# Patient Record
Sex: Male | Born: 1990 | Race: Black or African American | Hispanic: No | Marital: Single | Smoking: Never smoker
Health system: Southern US, Community
[De-identification: ages and names within clinical notes are randomized; demographics above are authoritative.]

## PROBLEM LIST (undated history)

## (undated) ENCOUNTER — Emergency Department (HOSPITAL_COMMUNITY): Admission: EM | Payer: BLUE CROSS/BLUE SHIELD | Source: Home / Self Care

## (undated) ENCOUNTER — Emergency Department (HOSPITAL_COMMUNITY): Admission: EM | Payer: Self-pay | Source: Home / Self Care

## (undated) HISTORY — PX: WRIST SURGERY: SHX841

---

## 2011-01-12 ENCOUNTER — Emergency Department (HOSPITAL_COMMUNITY): Payer: BC Managed Care – PPO

## 2011-01-12 ENCOUNTER — Emergency Department (HOSPITAL_COMMUNITY)
Admission: EM | Admit: 2011-01-12 | Discharge: 2011-01-12 | Disposition: A | Discharge status: Home / Self Care | Payer: BC Managed Care – PPO | Attending: Emergency Medicine | Admitting: Emergency Medicine

## 2011-01-12 DIAGNOSIS — Y9367 Activity, basketball: Secondary | ICD-10-CM | POA: Insufficient documentation

## 2011-01-12 DIAGNOSIS — X500XXA Overexertion from strenuous movement or load, initial encounter: Secondary | ICD-10-CM | POA: Insufficient documentation

## 2011-01-12 DIAGNOSIS — S93409A Sprain of unspecified ligament of unspecified ankle, initial encounter: Secondary | ICD-10-CM | POA: Insufficient documentation

## 2011-01-12 DIAGNOSIS — M25579 Pain in unspecified ankle and joints of unspecified foot: Secondary | ICD-10-CM | POA: Insufficient documentation

## 2011-01-17 ENCOUNTER — Inpatient Hospital Stay (INDEPENDENT_AMBULATORY_CARE_PROVIDER_SITE_OTHER)
Admission: RE | Admit: 2011-01-17 | Discharge: 2011-01-17 | Disposition: A | Discharge status: Home / Self Care | Payer: Self-pay | Source: Ambulatory Visit | Attending: Emergency Medicine | Admitting: Emergency Medicine

## 2011-01-17 DIAGNOSIS — N342 Other urethritis: Secondary | ICD-10-CM

## 2011-01-18 LAB — GC/CHLAMYDIA PROBE AMP, GENITAL
Chlamydia, DNA Probe: POSITIVE — AB
GC Probe Amp, Genital: NEGATIVE

## 2011-04-28 ENCOUNTER — Emergency Department (HOSPITAL_COMMUNITY)
Admission: EM | Admit: 2011-04-28 | Discharge: 2011-04-28 | Disposition: A | Discharge status: Home / Self Care | Payer: No Typology Code available for payment source | Attending: Emergency Medicine | Admitting: Emergency Medicine

## 2011-04-28 ENCOUNTER — Encounter (HOSPITAL_COMMUNITY): Payer: Self-pay | Admitting: *Deleted

## 2011-04-28 DIAGNOSIS — S139XXA Sprain of joints and ligaments of unspecified parts of neck, initial encounter: Secondary | ICD-10-CM | POA: Insufficient documentation

## 2011-04-28 DIAGNOSIS — M546 Pain in thoracic spine: Secondary | ICD-10-CM | POA: Insufficient documentation

## 2011-04-28 DIAGNOSIS — M542 Cervicalgia: Secondary | ICD-10-CM | POA: Insufficient documentation

## 2011-04-28 DIAGNOSIS — M25519 Pain in unspecified shoulder: Secondary | ICD-10-CM | POA: Insufficient documentation

## 2011-04-28 DIAGNOSIS — S161XXA Strain of muscle, fascia and tendon at neck level, initial encounter: Secondary | ICD-10-CM

## 2011-04-28 MED ORDER — OXYCODONE-ACETAMINOPHEN 5-325 MG PO TABS
1.0000 | ORAL_TABLET | Freq: Once | ORAL | Status: AC
  Administered 2011-04-28: 1 via ORAL
  Filled 2011-04-28: qty 1

## 2011-04-28 MED ORDER — IBUPROFEN 200 MG PO TABS
400.0000 mg | ORAL_TABLET | Freq: Once | ORAL | Status: AC
  Administered 2011-04-28: 400 mg via ORAL
  Filled 2011-04-28: qty 2

## 2011-04-28 MED ORDER — OXYCODONE-ACETAMINOPHEN 5-325 MG PO TABS: 1.0000 | ORAL_TABLET | ORAL | Status: DC | PRN

## 2011-04-28 MED ORDER — IBUPROFEN 400 MG PO TABS: 400.0000 mg | ORAL_TABLET | Freq: Four times a day (QID) | ORAL | Status: DC | PRN
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
## 2011-05-08 ENCOUNTER — Emergency Department (INDEPENDENT_AMBULATORY_CARE_PROVIDER_SITE_OTHER)
Admission: EM | Admit: 2011-05-08 | Discharge: 2011-05-08 | Disposition: A | Discharge status: Home / Self Care | Payer: Self-pay | Source: Home / Self Care | Attending: Emergency Medicine | Admitting: Emergency Medicine

## 2011-05-08 ENCOUNTER — Encounter (HOSPITAL_COMMUNITY): Payer: Self-pay | Admitting: *Deleted

## 2011-05-08 DIAGNOSIS — N342 Other urethritis: Secondary | ICD-10-CM

## 2011-05-08 MED ORDER — CEFTRIAXONE SODIUM 250 MG IJ SOLR
250.0000 mg | Freq: Once | INTRAMUSCULAR | Status: AC
  Administered 2011-05-08: 250 mg via INTRAMUSCULAR

## 2011-05-08 MED ORDER — LIDOCAINE HCL (PF) 1 % IJ SOLN
INTRAMUSCULAR | Status: AC
  Filled 2011-05-08: qty 5

## 2011-05-08 MED ORDER — CEFTRIAXONE SODIUM 250 MG IJ SOLR
INTRAMUSCULAR | Status: AC
  Filled 2011-05-08: qty 250

## 2011-05-08 MED ORDER — AZITHROMYCIN 250 MG PO TABS
1000.0000 mg | ORAL_TABLET | Freq: Once | ORAL | Status: AC
  Administered 2011-05-08: 1000 mg via ORAL

## 2011-05-08 MED ORDER — AZITHROMYCIN 250 MG PO TABS
ORAL_TABLET | ORAL | Status: AC
  Filled 2011-05-08: qty 4

## 2011-05-08 NOTE — ED Notes (Signed)
pT  WANTS  TO  BE  CHECKED  FOR  AN STD   HE  DENYS  ANY  PENILE  DISCHARGE  HE  DENYS  ANY  BURNING  ON  URINATION  HE  DENYS  ANY  SORES  OR  LESIONS  -  HE  SAYS  IT  FEELS  DIFFERENT  WHEN HE  HAS  SEX  AND  HAS  A  PROLONGED PERIOD  BEFORE  EJACULATION    HE  STATES  HE  WAS  NON  COMPLIANT  ON PREVIOUS  STD  [place]  IN PAST

## 2011-05-08 NOTE — ED Provider Notes (Signed)
Chief Complaint  Patient presents with  . Exposure to STD    History of Present Illness:  The patient is a 21 year old male who presents tonight for evaluation for STDs. Both he and his girlfriend have had chlamydia in the past. He's not sure whether she has had treatment for it. They had unprotected intercourse 2 weeks ago and ever since then he's noted urethral irritation, some dribbling after voiding, some hesitancy, but no discharge, dysuria, or penile pain. He's had no fever, chills, adenopathy, skin rash, testicular pain, or swelling. The patient states he has just one sexual partner. He does not use condoms.  Review of Systems:  Other than noted above, the patient denies any of the following symptoms: Systemic:  No fevers chills, aches, weight loss, arthralgias, myalgias, or adenopathy. GI:  No abdominal pain, nausea or vomiting. GU:  No dysuria, penile pain, discharge, itching, dysuria, genital lesions, testicular pain or swelling. Skin:  No rash or itching.  PMFSH:  Past medical history, family history, social history, meds, and allergies were reviewed.  Physical Exam:   Vital signs:  BP 115/70  Pulse 88  Temp(Src) 98.4 F (36.9 C) (Oral)  Resp 14  SpO2 99% Gen:  Alert, oriented, in no distress. Abdomen:  Soft and flat, non-distended, and non-tender.  No organomegaly or mass. Genital:  There was no urethral discharge, no lesions on the penis of the genital area testes were normal, nontender, without any masses. There was no inguinal lymphadenopathy. Skin:  Warm and dry.  No rash.   Labs:   Results for orders placed during the hospital encounter of 01/17/11  GC/CHLAMYDIA PROBE AMP, GENITAL      Component Value Range   GC Probe Amp, Genital NEGATIVE  NEGATIVE    Chlamydia, DNA Probe POSITIVE (*) NEGATIVE      Medications given in [facility]:  He was given ceftriaxone 250 mg IM and azithromycin 1 g by mouth.  Assessment:   Diagnoses that have been ruled out:  None  Diagnoses  that are still under consideration:  None  Final diagnoses:  Urethritis    Plan:   1.  The following meds were prescribed:   New Prescriptions   No medications on file   2.  The patient was instructed in symptomatic care and handouts were given. 3.  The patient was told to return if becoming worse in any way, if no better in 3 or 4 days, and given some red flag symptoms that would indicate earlier return. 4.  The patient was instructed to inform all sexual contacts, avoid intercourse completely for 2 weeks and then only with a condom.  The patient was told that we would call about all abnormal lab results, and that we would need to report certain kinds of infection to the health department.    Roque Lias, MD 05/08/11 1900

## 2011-05-09 LAB — HIV ANTIBODY (ROUTINE TESTING W REFLEX): HIV: NONREACTIVE

## 2011-05-09 LAB — GC/CHLAMYDIA PROBE AMP, GENITAL
Chlamydia, DNA Probe: NEGATIVE
GC Probe Amp, Genital: NEGATIVE

## 2011-05-11 ENCOUNTER — Telehealth (HOSPITAL_COMMUNITY): Payer: Self-pay | Admitting: *Deleted

## 2011-08-17 ENCOUNTER — Encounter (HOSPITAL_COMMUNITY): Payer: Self-pay

## 2011-08-17 ENCOUNTER — Emergency Department (HOSPITAL_COMMUNITY)
Admission: EM | Admit: 2011-08-17 | Discharge: 2011-08-17 | Disposition: A | Discharge status: Home / Self Care | Payer: BC Managed Care – PPO | Attending: Emergency Medicine | Admitting: Emergency Medicine

## 2011-08-17 DIAGNOSIS — Z202 Contact with and (suspected) exposure to infections with a predominantly sexual mode of transmission: Secondary | ICD-10-CM

## 2011-08-17 LAB — URINALYSIS, ROUTINE W REFLEX MICROSCOPIC
Bilirubin Urine: NEGATIVE
Hgb urine dipstick: NEGATIVE
Ketones, ur: NEGATIVE mg/dL
Specific Gravity, Urine: 1.023 (ref 1.005–1.030)
Urobilinogen, UA: 1 mg/dL (ref 0.0–1.0)

## 2011-08-17 MED ORDER — CEFTRIAXONE SODIUM 250 MG IJ SOLR
250.0000 mg | Freq: Once | INTRAMUSCULAR | Status: AC
  Administered 2011-08-17: 250 mg via INTRAMUSCULAR
  Filled 2011-08-17: qty 250

## 2011-08-17 MED ORDER — AZITHROMYCIN 250 MG PO TABS
1000.0000 mg | ORAL_TABLET | Freq: Once | ORAL | Status: AC
  Administered 2011-08-17: 1000 mg via ORAL
  Filled 2011-08-17: qty 4

## 2011-08-17 MED ORDER — LIDOCAINE HCL (PF) 1 % IJ SOLN
INTRAMUSCULAR | Status: AC
  Administered 2011-08-17: 5 mL
  Filled 2011-08-17: qty 5

## 2011-08-17 NOTE — ED Notes (Signed)
Pt reporting STD exposure, reporting symptomatic today; c/o white discharge, denying any burning or pain with urination. Pt ambulated to room; denying any pain at this time. NAD

## 2011-08-17 NOTE — Discharge Instructions (Signed)
Sexually Transmitted Disease Sexually transmitted disease (STD) refers to any infection that is passed from person to person during sexual activity. This may happen by way of saliva, semen, blood, vaginal mucus, or urine. Common STDs include:  Gonorrhea.   Chlamydia.   Syphilis.   HIV/AIDS.   Genital herpes.   Hepatitis B and C.   Trichomonas.   Human papillomavirus (HPV).   Pubic lice.  CAUSES  An STD may be spread by bacteria, virus, or parasite. A person can get an STD by:  Sexual intercourse with an infected person.   Sharing sex toys with an infected person.   Sharing needles with an infected person.   Having intimate contact with the genitals, mouth, or rectal areas of an infected person.  SYMPTOMS  Some people may not have any symptoms, but they can still pass the infection to others. Different STDs have different symptoms. Symptoms include:  Painful or bloody urination.   Pain in the pelvis, abdomen, vagina, anus, throat, or eyes.   Skin rash, itching, irritation, growths, or sores (lesions). These usually occur in the genital or anal area.   Abnormal vaginal discharge.   Penile discharge in men.   Soft, flesh-colored skin growths in the genital or anal area.   Fever.   Pain or bleeding during sexual intercourse.   Swollen glands in the groin area.   Yellow skin and eyes (jaundice). This is seen with hepatitis.  DIAGNOSIS  To make a diagnosis, your caregiver may:  Take a medical history.   Perform a physical exam.   Take a specimen (culture) to be examined.   Examine a sample of discharge under a microscope.   Perform blood tests.   Perform a Pap test, if this applies.   Perform a colposcopy.   Perform a laparoscopy.  TREATMENT   Chlamydia, gonorrhea, trichomonas, and syphilis can be cured with antibiotic medicine.   Genital herpes, hepatitis, and HIV can be treated, but not cured, with prescribed medicines. The medicines will lessen  the symptoms.   Genital warts from HPV can be treated with medicine or by freezing, burning (electrocautery), or surgery. Warts may come back.   HPV is a virus and cannot be cured with medicine or surgery.However, abnormal areas may be followed very closely by your caregiver and may be removed from the cervix, vagina, or vulva through office procedures or surgery.  If your diagnosis is confirmed, your recent sexual partners need treatment. This is true even if they are symptom-free or have a negative culture or evaluation. They should not have sex until their caregiver says it is okay. HOME CARE INSTRUCTIONS  All sexual partners should be informed, tested, and treated for all STDs.   Take your antibiotics as directed. Finish them even if you start to feel better.   Only take over-the-counter or prescription medicines for pain, discomfort, or fever as directed by your caregiver.   Rest.   Eat a balanced diet and drink enough fluids to keep your urine clear or pale yellow.   Do not have sex until treatment is completed and you have followed up with your caregiver. STDs should be checked after treatment.   Keep all follow-up appointments, Pap tests, and blood tests as directed by your caregiver.   Only use latex condoms and water-soluble lubricants during sexual activity. Do not use petroleum jelly or oils.   Avoid alcohol and illegal drugs.   Get vaccinated for HPV and hepatitis. If you have not received these vaccines   in the past, talk to your caregiver about whether one or both might be right for you.   Avoid risky sex practices that can break the skin.  The only way to avoid getting an STD is to avoid all sexual activity.Latex condoms and dental dams (for oral sex) will help lessen the risk of getting an STD, but will not completely eliminate the risk. SEEK MEDICAL CARE IF:   You have a fever.   You have any new or worsening symptoms.  Document Released: 06/17/2002 Document  Revised: 03/16/2011 Document Reviewed: 06/24/2010 [employer] Patient Information 2012 [employer]. 

## 2011-08-17 NOTE — ED Notes (Signed)
Pt sts exposed to std, sts unknown which one it is.

## 2011-08-17 NOTE — ED Provider Notes (Signed)
Medical screening examination/treatment/procedure(s) were performed by non-physician practitioner and as supervising physician I was immediately available for consultation/collaboration.   Forbes Cellar, MD 08/17/11 1651

## 2011-08-17 NOTE — ED Provider Notes (Signed)
History     CSN: [id number]  Arrival date & time 08/17/11  1450   First MD Initiated Contact with Patient 08/17/11 1537      Chief Complaint  Patient presents with  . Exposure to STD    (Consider location/radiation/quality/duration/timing/severity/associated sxs/prior treatment) Patient is a 21 y.o. male presenting with STD exposure. The history is provided by the patient. No language interpreter was used.  Exposure to STD This is a new problem. The current episode started in the past 7 days. The problem occurs 2 to 4 times per day. The problem has been gradually worsening. Associated symptoms include urinary symptoms. Pertinent negatives include no abdominal pain, fever, nausea, sore throat, swollen glands or vomiting.    History reviewed. No pertinent past medical history.  Past Surgical History  Procedure Date  . Wrist surgery     History reviewed. No pertinent family history.  History  Substance Use Topics  . Smoking status: Never Smoker   . Smokeless tobacco: Not on file  . Alcohol Use: No      Review of Systems  Constitutional: Negative for fever.  HENT: Negative for sore throat.   Gastrointestinal: Negative for nausea, vomiting and abdominal pain.  Genitourinary: Positive for discharge. Negative for frequency, difficulty urinating, genital sores and testicular pain.  All other systems reviewed and are negative.    Allergies  Review of patient's allergies indicates no known allergies.  Home Medications  No current outpatient prescriptions on file.  BP 125/68  Pulse 75  Temp(Src) 98.3 F (36.8 C) (Oral)  Resp 18  SpO2 98%  Physical Exam  Nursing note and vitals reviewed. Constitutional: He is oriented to person, place, and time. He appears well-developed and well-nourished. No distress.  HENT:  Head: Normocephalic.  Eyes: Pupils are equal, round, and reactive to light.  Neck: Normal range of motion. Neck supple.  Cardiovascular: Normal rate,  regular rhythm, normal heart sounds and intact distal pulses.   Pulmonary/Chest: Effort normal and breath sounds normal.  Abdominal: Soft. Bowel sounds are normal. There is no tenderness.  Genitourinary: Testes normal. Cremasteric reflex is present. Discharge found.  Musculoskeletal: Normal range of motion.  Lymphadenopathy:    He has no cervical adenopathy.  Neurological: He is alert and oriented to person, place, and time.  Skin: Skin is warm and dry.  Psychiatric: He has a normal mood and affect. His behavior is normal. Judgment and thought content normal.    ED Course  Procedures (including critical care time)   Labs Reviewed  URINALYSIS, ROUTINE W REFLEX MICROSCOPIC  GC/CHLAMYDIA PROBE AMP, GENITAL   No results found.   No diagnosis found.  STD exposure.  MDM          Jimmye Norman, NP 08/17/11 1649

## 2011-08-18 LAB — GC/CHLAMYDIA PROBE AMP, GENITAL: Chlamydia, DNA Probe: NEGATIVE

## 2012-12-03 IMAGING — CR DG ANKLE COMPLETE 3+V*R*
3 series · 3 of 3 positions shown · non-contrast
Comparison: None.

CLINICAL DATA: Twisting basketball injury, swelling

RIGHT ANKLE - COMPLETE 3+ VIEW

[t ankle joint ap right]
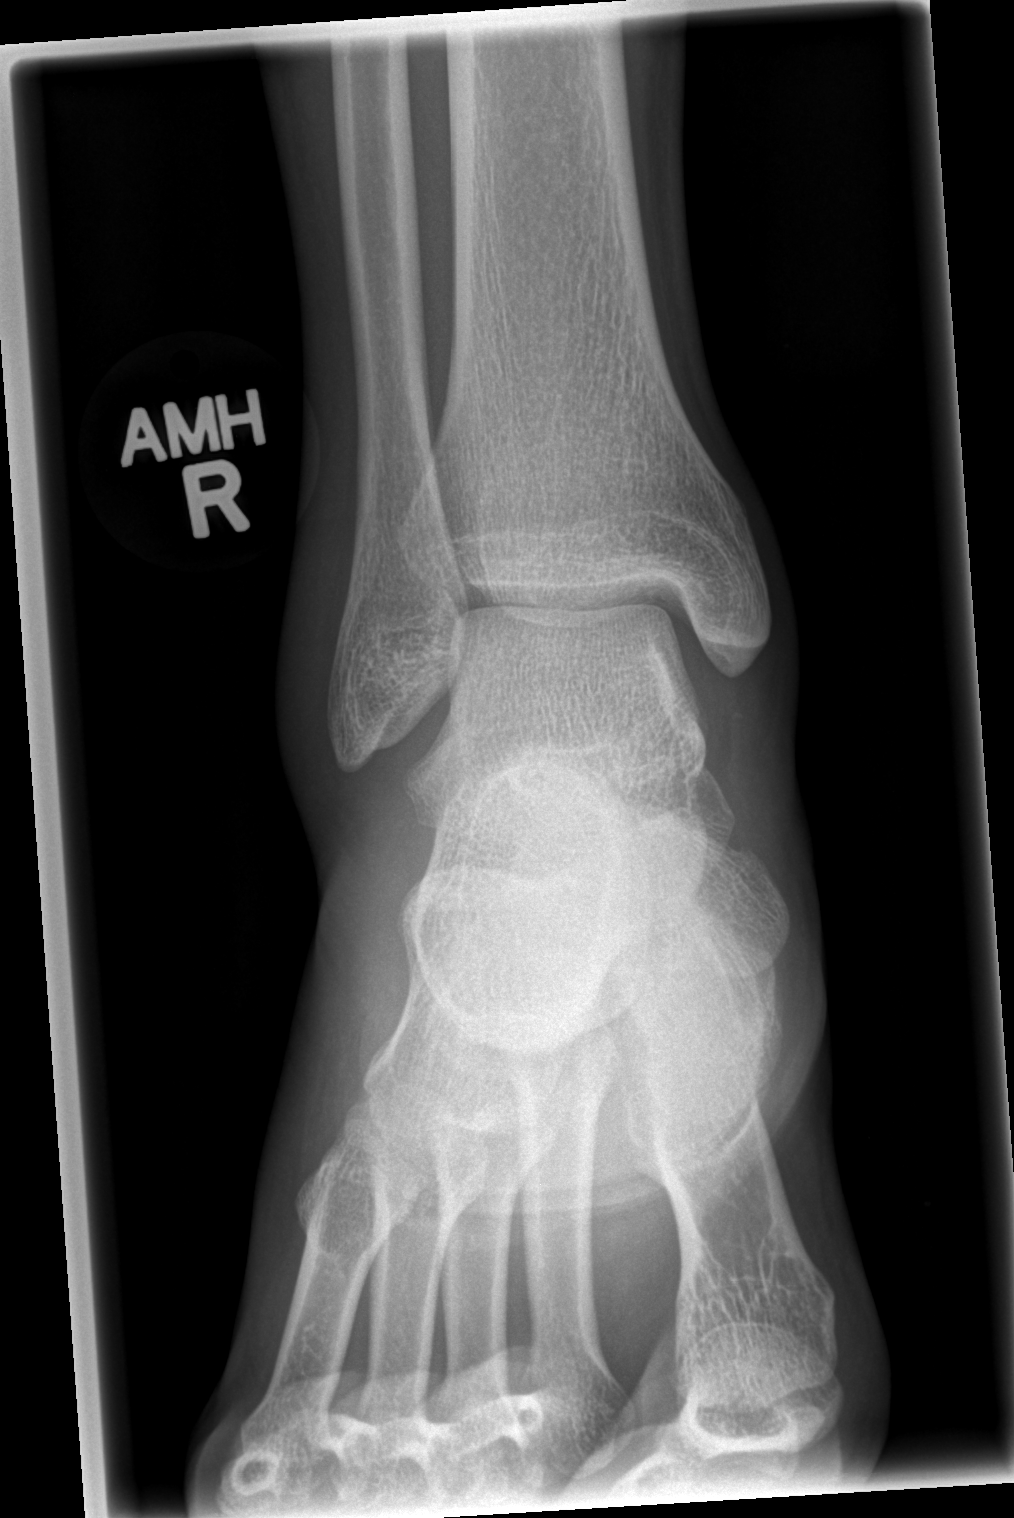

[t ankle joint oblique right]
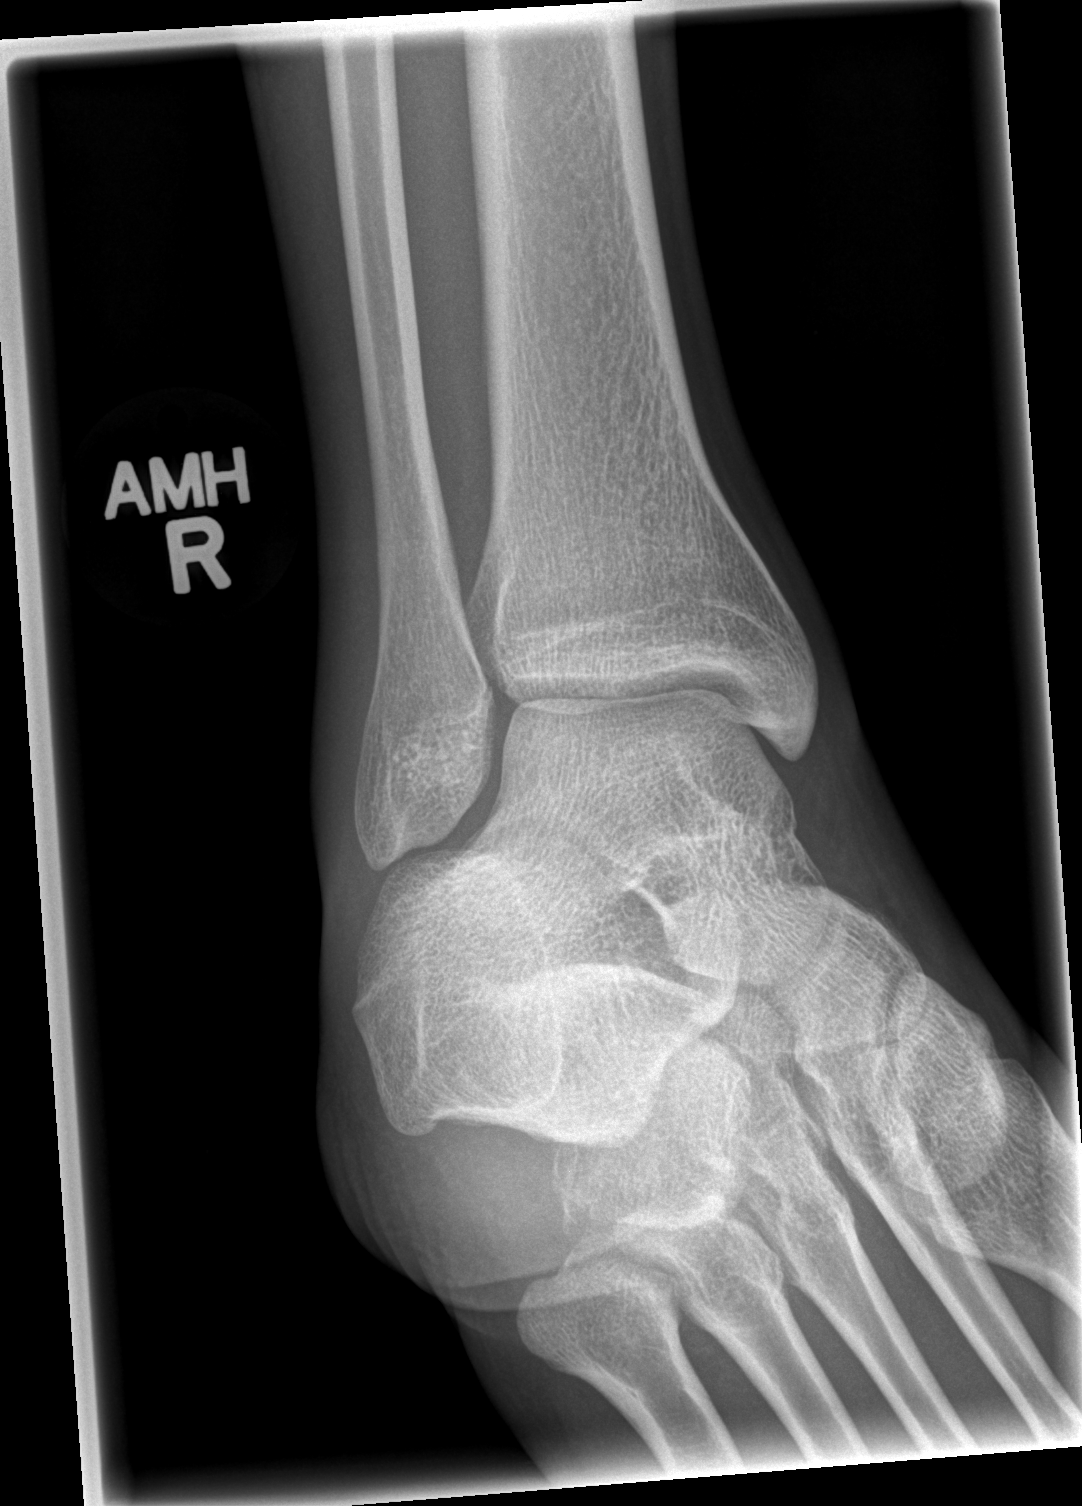

[t ankle joint lat right]
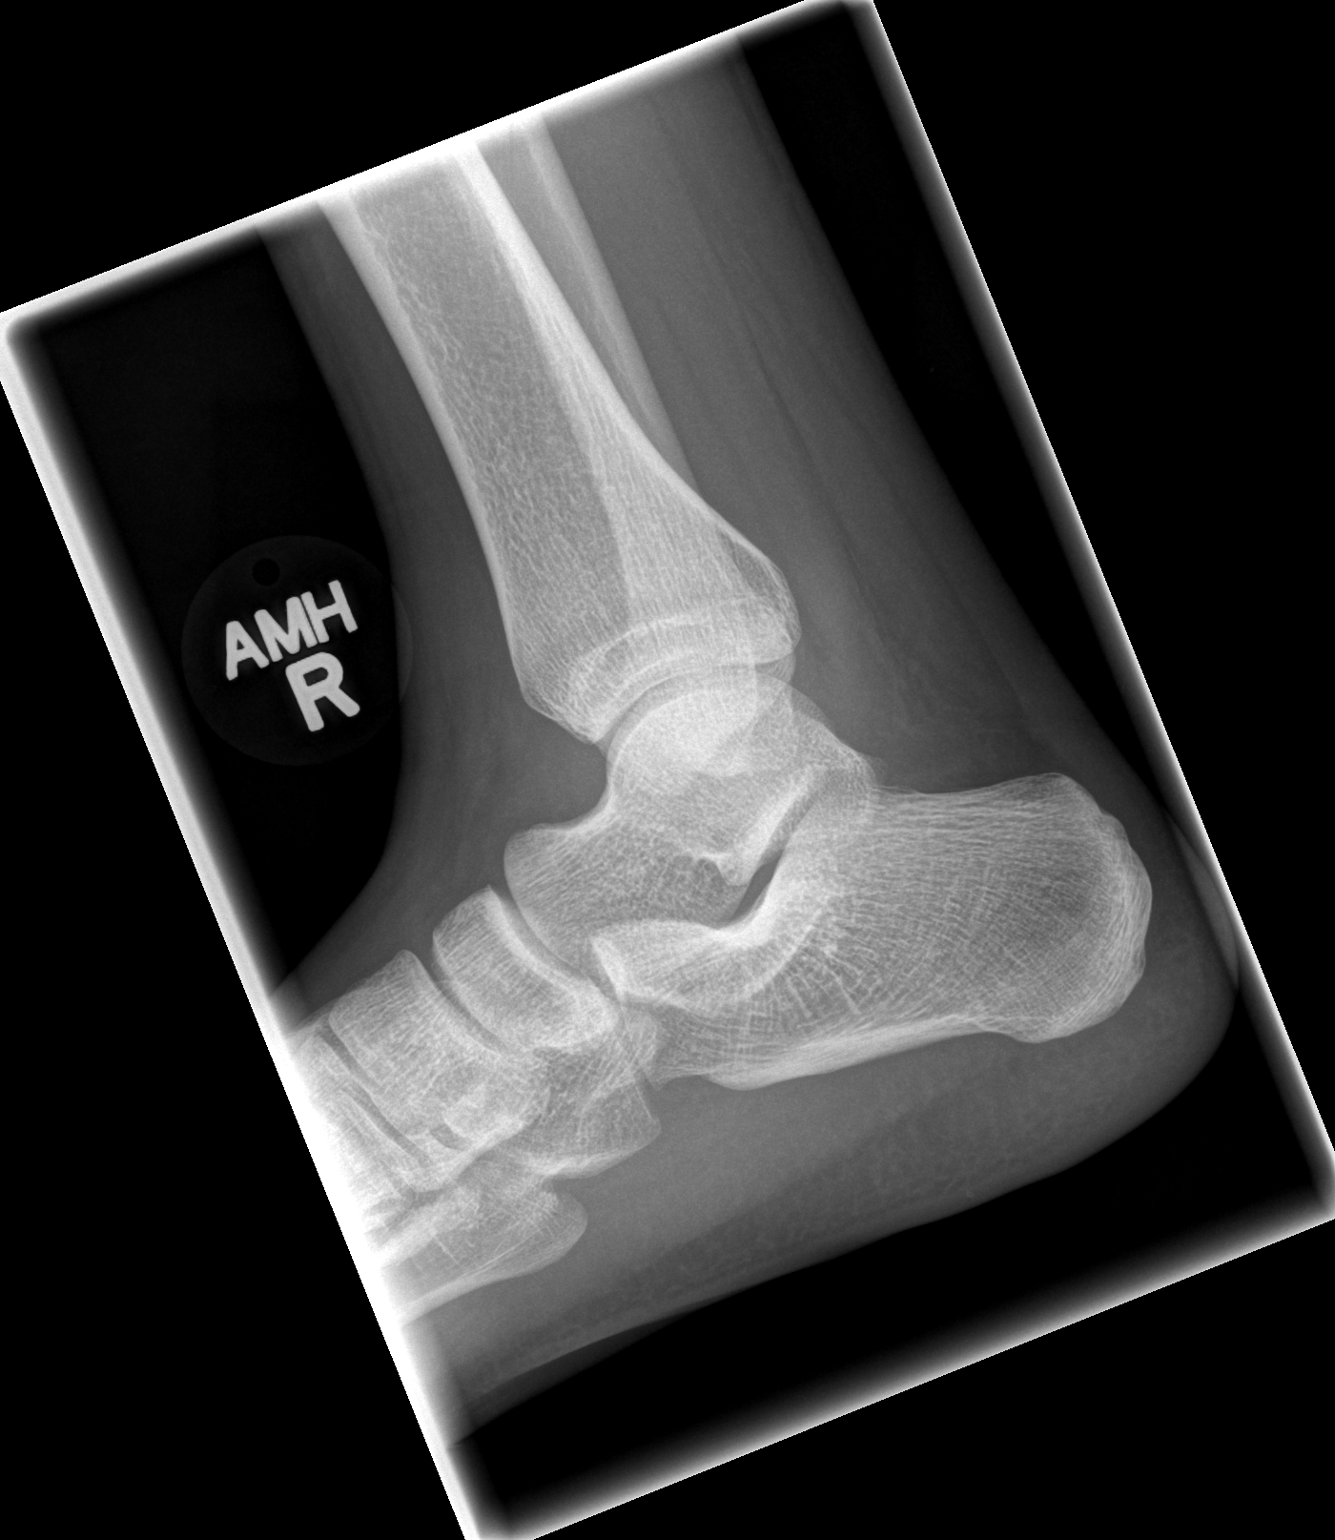

[3 of 3 positions shown; findings below may reference images not displayed]

FINDINGS: Mild swelling laterally of the soft tissues.  No
fracture.  Normal alignment.  Intact malleoli, talus and calcaneus.
IMPRESSION: Mild soft tissue swelling.  No acute osseous finding

## 2014-05-14 ENCOUNTER — Emergency Department (HOSPITAL_COMMUNITY)
Admission: EM | Admit: 2014-05-14 | Discharge: 2014-05-14 | Disposition: A | Discharge status: Home / Self Care | Payer: No Typology Code available for payment source | Attending: Emergency Medicine | Admitting: Emergency Medicine

## 2014-05-14 ENCOUNTER — Emergency Department (HOSPITAL_COMMUNITY): Payer: No Typology Code available for payment source

## 2014-05-14 ENCOUNTER — Encounter (HOSPITAL_COMMUNITY): Payer: Self-pay | Admitting: *Deleted

## 2014-05-14 DIAGNOSIS — Y998 Other external cause status: Secondary | ICD-10-CM | POA: Insufficient documentation

## 2014-05-14 DIAGNOSIS — S3992XA Unspecified injury of lower back, initial encounter: Secondary | ICD-10-CM | POA: Insufficient documentation

## 2014-05-14 DIAGNOSIS — S6991XA Unspecified injury of right wrist, hand and finger(s), initial encounter: Secondary | ICD-10-CM | POA: Insufficient documentation

## 2014-05-14 DIAGNOSIS — S79921A Unspecified injury of right thigh, initial encounter: Secondary | ICD-10-CM | POA: Insufficient documentation

## 2014-05-14 DIAGNOSIS — S4991XA Unspecified injury of right shoulder and upper arm, initial encounter: Secondary | ICD-10-CM | POA: Insufficient documentation

## 2014-05-14 DIAGNOSIS — Y92410 Unspecified street and highway as the place of occurrence of the external cause: Secondary | ICD-10-CM | POA: Insufficient documentation

## 2014-05-14 DIAGNOSIS — Y9389 Activity, other specified: Secondary | ICD-10-CM | POA: Insufficient documentation

## 2014-05-14 DIAGNOSIS — S29001A Unspecified injury of muscle and tendon of front wall of thorax, initial encounter: Secondary | ICD-10-CM | POA: Insufficient documentation

## 2014-05-14 DIAGNOSIS — S79922A Unspecified injury of left thigh, initial encounter: Secondary | ICD-10-CM | POA: Insufficient documentation

## 2014-05-14 MED ORDER — METHOCARBAMOL 500 MG PO TABS: 500.0000 mg | ORAL_TABLET | Freq: Two times a day (BID) | ORAL | Status: DC

## 2014-05-14 MED ORDER — IBUPROFEN 800 MG PO TABS: 800.0000 mg | ORAL_TABLET | Freq: Three times a day (TID) | ORAL | Status: AC

## 2014-05-14 MED ORDER — ACETAMINOPHEN 325 MG PO TABS
650.0000 mg | ORAL_TABLET | Freq: Once | ORAL | Status: AC
  Administered 2014-05-14: 650 mg via ORAL
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
## 2014-05-14 NOTE — Discharge Instructions (Signed)

## 2014-06-07 ENCOUNTER — Emergency Department (HOSPITAL_COMMUNITY)
Admission: EM | Admit: 2014-06-07 | Discharge: 2014-06-07 | Discharge status: Against Medical Advice | Payer: Self-pay | Attending: Emergency Medicine | Admitting: Emergency Medicine

## 2014-06-07 ENCOUNTER — Encounter (HOSPITAL_COMMUNITY): Payer: Self-pay | Admitting: Emergency Medicine

## 2014-06-07 DIAGNOSIS — Z79899 Other long term (current) drug therapy: Secondary | ICD-10-CM | POA: Insufficient documentation

## 2014-06-07 DIAGNOSIS — S61219A Laceration without foreign body of unspecified finger without damage to nail, initial encounter: Secondary | ICD-10-CM

## 2014-06-07 DIAGNOSIS — S61215A Laceration without foreign body of left ring finger without damage to nail, initial encounter: Secondary | ICD-10-CM | POA: Insufficient documentation

## 2014-06-07 DIAGNOSIS — W25XXXA Contact with sharp glass, initial encounter: Secondary | ICD-10-CM | POA: Insufficient documentation

## 2014-06-07 DIAGNOSIS — Y9289 Other specified places as the place of occurrence of the external cause: Secondary | ICD-10-CM | POA: Insufficient documentation

## 2014-06-07 DIAGNOSIS — Z791 Long term (current) use of non-steroidal anti-inflammatories (NSAID): Secondary | ICD-10-CM | POA: Insufficient documentation

## 2014-06-07 DIAGNOSIS — S61213A Laceration without foreign body of left middle finger without damage to nail, initial encounter: Secondary | ICD-10-CM | POA: Insufficient documentation

## 2014-06-07 DIAGNOSIS — Y9389 Activity, other specified: Secondary | ICD-10-CM | POA: Insufficient documentation

## 2014-06-07 DIAGNOSIS — Y998 Other external cause status: Secondary | ICD-10-CM | POA: Insufficient documentation

## 2014-06-07 NOTE — Discharge Instructions (Signed)
You're leaving AGAINST MEDICAL ADVICE. It was advised to get an x-ray to evaluate for foreign bodies of glass in your finger. Follow-up at this department or an urgent care center if you develop redness, swelling or fevers to the area.  Laceration Care, Adult A laceration is a cut or lesion that goes through all layers of the skin and into the tissue just beneath the skin. TREATMENT  Some lacerations may not require closure. Some lacerations may not be able to be closed due to an increased risk of infection. It is important to see your caregiver as soon as possible after an injury to minimize the risk of infection and maximize the opportunity for successful closure. If closure is appropriate, pain medicines may be given, if needed. The wound will be cleaned to help prevent infection. Your caregiver will use stitches (sutures), staples, wound glue (adhesive), or skin adhesive strips to repair the laceration. These tools bring the skin edges together to allow for faster healing and a better cosmetic outcome. However, all wounds will heal with a scar. Once the wound has healed, scarring can be minimized by covering the wound with sunscreen during the day for 1 full year. HOME CARE INSTRUCTIONS  For sutures or staples:  Keep the wound clean and dry.  If you were given a bandage (dressing), you should change it at least once a day. Also, change the dressing if it becomes wet or dirty, or as directed by your caregiver.  Wash the wound with soap and water 2 times a day. Rinse the wound off with water to remove all soap. Pat the wound dry with a clean towel.  After cleaning, apply a thin layer of the antibiotic ointment as recommended by your caregiver. This will help prevent infection and keep the dressing from sticking.  You may shower as usual after the first 24 hours. Do not soak the wound in water until the sutures are removed.  Only take over-the-counter or prescription medicines for pain,  discomfort, or fever as directed by your caregiver.  Get your sutures or staples removed as directed by your caregiver. For skin adhesive strips:  Keep the wound clean and dry.  Do not get the skin adhesive strips wet. You may bathe carefully, using caution to keep the wound dry.  If the wound gets wet, pat it dry with a clean towel.  Skin adhesive strips will fall off on their own. You may trim the strips as the wound heals. Do not remove skin adhesive strips that are still stuck to the wound. They will fall off in time. For wound adhesive:  You may briefly wet your wound in the shower or bath. Do not soak or scrub the wound. Do not swim. Avoid periods of heavy perspiration until the skin adhesive has fallen off on its own. After showering or bathing, gently pat the wound dry with a clean towel.  Do not apply liquid medicine, cream medicine, or ointment medicine to your wound while the skin adhesive is in place. This may loosen the film before your wound is healed.  If a dressing is placed over the wound, be careful not to apply tape directly over the skin adhesive. This may cause the adhesive to be pulled off before the wound is healed.  Avoid prolonged exposure to sunlight or tanning lamps while the skin adhesive is in place. Exposure to ultraviolet light in the first year will darken the scar.  The skin adhesive will usually remain in place for  5 to 10 days, then naturally fall off the skin. Do not pick at the adhesive film. You may need a tetanus shot if:  You cannot remember when you had your last tetanus shot.  You have never had a tetanus shot. If you get a tetanus shot, your arm may swell, get red, and feel warm to the touch. This is common and not a problem. If you need a tetanus shot and you choose not to have one, there is a rare chance of getting tetanus. Sickness from tetanus can be serious. SEEK MEDICAL CARE IF:   You have redness, swelling, or increasing pain in the  wound.  You see a red line that goes away from the wound.  You have yellowish-white fluid (pus) coming from the wound.  You have a fever.  You notice a bad smell coming from the wound or dressing.  Your wound breaks open before or after sutures have been removed.  You notice something coming out of the wound such as wood or glass.  Your wound is on your hand or foot and you cannot move a finger or toe. SEEK IMMEDIATE MEDICAL CARE IF:   Your pain is not controlled with prescribed medicine.  You have severe swelling around the wound causing pain and numbness or a change in color in your arm, hand, leg, or foot.  Your wound splits open and starts bleeding.  You have worsening numbness, weakness, or loss of function of any joint around or beyond the wound.  You develop painful lumps near the wound or on the skin anywhere on your body. MAKE SURE YOU:   Understand these instructions.  Will watch your condition.  Will get help right away if you are not doing well or get worse. Document Released: 03/27/2005 Document Revised: 06/19/2011 Document Reviewed: 09/20/2010 [facility] Patient Information 2015 [place], [place]. This information is not intended to replace advice given to you by your health care provider. Make sure you discuss any questions you have with your health care provider.

## 2014-06-07 NOTE — ED Notes (Signed)
Refused discharge vital signs.  Pt requesting bandaids to cover wounds.  Bandaids to wounds applied.

## 2014-06-07 NOTE — ED Provider Notes (Signed)
CSN: [id number]     Arrival date & time 06/07/14  1126 History  This chart was scribed for non-physician practitioner, Celene Skeen, PA-C,  working with Gwyneth Sprout, MD by Freida Busman, ED Scribe. This patient was seen in room TR06C/TR06C and the patient's care was started at 12:30 PM.    Chief Complaint  Patient presents with  . Extremity Laceration   HPI   HPI Comments:  Riley Ramos is a 24 y.o. male who presents to the Emergency Department complaining of laceration to his left ring and middle fingers following injury ~2 hours PTA. He was inserting a window into its frame when shattered lacerating his fingers. His tetanus is UTD within the last 5 years.  No alleviating factors or associated symptoms noted.   History reviewed. No pertinent past medical history. Past Surgical History  Procedure Laterality Date  . Wrist surgery     History reviewed. No pertinent family history. History  Substance Use Topics  . Smoking status: Never Smoker   . Smokeless tobacco: Not on file  . Alcohol Use: Yes     Comment: occ    Review of Systems  Constitutional: Negative for fever.  Respiratory: Negative for shortness of breath.   Cardiovascular: Negative for chest pain.  Skin: Positive for wound.      Allergies  Review of patient's allergies indicates no known allergies.  Home Medications   Prior to Admission medications   Medication Sig Start Date End Date Taking? Authorizing Provider  ibuprofen (ADVIL,MOTRIN) 800 MG tablet Take 1 tablet (800 mg total) by mouth 3 (three) times daily. 05/14/14   Fayrene Helper, PA-C  methocarbamol (ROBAXIN) 500 MG tablet Take 1 tablet (500 mg total) by mouth 2 (two) times daily. 05/14/14   Fayrene Helper, PA-C   BP 106/49 mmHg  Pulse 99  Temp(Src) 97.8 F (36.6 C)  Resp 18  Ht  (1.651 m)  Wt 136 lb (61.689 kg)  BMI 22.63 kg/m2  SpO2 98% Physical Exam  Constitutional: He is oriented to person, place, and time. He appears well-developed and  well-nourished. No distress.  HENT:  Head: Normocephalic and atraumatic.  Eyes: Conjunctivae and EOM are normal.  Neck: Normal range of motion. Neck supple.  Cardiovascular: Normal rate, regular rhythm and normal heart sounds.   Pulmonary/Chest: Effort normal and breath sounds normal.  Musculoskeletal: Normal range of motion. He exhibits no edema.  Neurological: He is alert and oriented to person, place, and time.  Skin: Skin is warm and dry.  Laceration to the distal tip of left middle and ring finger. Does not involve the nail. No active bleeding. Refill less than 3 seconds. Full range of motion of fingers, full flexion and extension at MCP, PIP and DIP.  Psychiatric: He has a normal mood and affect. His behavior is normal.  Nursing note and vitals reviewed.   ED Course  Procedures   DIAGNOSTIC STUDIES:  Oxygen Saturation is 98% on RA, normal by my interpretation.    COORDINATION OF CARE:  12:35 PM Will order XR and if negative for foreign bodies will apply dermabond to the cleaned wound. Discussed treatment plan with pt at bedside and pt agreed to plan.  Labs Review Labs Reviewed - No data to display  Imaging Review No results found.   EKG Interpretation None      MDM   Final diagnoses:  Laceration of middle finger of left hand without complication, initial encounter  Finger laceration, initial encounter   Neurovascularly intact. Contact  with shattered glass, x-ray ordered to evaluate for foreign body. Discussed after x-ray result, would Dermabond finger. Patient reports he needs to go to work and left prior to x-ray.  I personally performed the services described in this documentation, which was scribed in my presence. The recorded information has been reviewed and is accurate.   Kathrynn SpeedRobyn M Shaunika Italiano, PA-C 06/07/14 1405  Gwyneth SproutWhitney Plunkett, MD 06/07/14 1536

## 2014-06-07 NOTE — ED Notes (Signed)
Patient advised Charna Elizabethudrey RN that he is refusing xray, Magda Paganiniudrey advised PA.

## 2014-06-07 NOTE — ED Notes (Signed)
Pt with laceration to left 3rd and 4th digit from glass and some small laceration to other hand as well; bleeding controlled

## 2014-08-19 ENCOUNTER — Emergency Department (HOSPITAL_COMMUNITY)
Admission: EM | Admit: 2014-08-19 | Discharge: 2014-08-20 | Disposition: A | Discharge status: Home / Self Care | Payer: BLUE CROSS/BLUE SHIELD | Attending: Emergency Medicine | Admitting: Emergency Medicine

## 2014-08-19 ENCOUNTER — Encounter (HOSPITAL_COMMUNITY): Payer: Self-pay | Admitting: Emergency Medicine

## 2014-08-19 DIAGNOSIS — J029 Acute pharyngitis, unspecified: Secondary | ICD-10-CM | POA: Diagnosis not present

## 2014-08-19 DIAGNOSIS — Z791 Long term (current) use of non-steroidal anti-inflammatories (NSAID): Secondary | ICD-10-CM | POA: Insufficient documentation

## 2014-08-19 DIAGNOSIS — Z79899 Other long term (current) drug therapy: Secondary | ICD-10-CM | POA: Diagnosis not present

## 2014-08-19 LAB — RAPID STREP SCREEN (MED CTR MEBANE ONLY): STREPTOCOCCUS, GROUP A SCREEN (DIRECT): NEGATIVE

## 2014-08-19 MED ORDER — IBUPROFEN 600 MG PO TABS: 600.0000 mg | ORAL_TABLET | Freq: Four times a day (QID) | ORAL | Status: AC | PRN

## 2014-08-19 MED ORDER — MAGIC MOUTHWASH: 5.0000 mL | Freq: Three times a day (TID) | ORAL | Status: AC | PRN

## 2014-08-19 MED ORDER — IBUPROFEN 400 MG PO TABS
800.0000 mg | ORAL_TABLET | Freq: Once | ORAL | Status: AC
  Administered 2014-08-20: 800 mg via ORAL
  Filled 2014-08-19: qty 2

## 2014-08-19 MED ORDER — MAGIC MOUTHWASH
5.0000 mL | Freq: Once | ORAL | Status: AC
  Administered 2014-08-20: 5 mL via ORAL
  Filled 2014-08-19: qty 5

## 2014-08-19 NOTE — ED Provider Notes (Signed)
CSN: [id number]     Arrival date & time 08/19/14  2250 History   First MD Initiated Contact with Patient 08/19/14 2351     Chief Complaint  Patient presents with  . Sore Throat    The patient said he has had a sore throat for two to three days.  He denies any fever, cough or any other symptoms.      (Consider location/radiation/quality/duration/timing/severity/associated sxs/prior Treatment) Patient is a 24 y.o. male presenting with pharyngitis.  Sore Throat This is a new problem. The current episode started in the past 7 days. The problem occurs constantly. The problem has been unchanged. Associated symptoms include chills, a fever, a sore throat and swollen glands. Pertinent negatives include no abdominal pain, change in bowel habit, congestion, coughing, nausea, neck pain or vomiting. The symptoms are aggravated by eating. Treatments tried: family's left over antibiotic. The treatment provided mild relief.    History reviewed. No pertinent past medical history. Past Surgical History  Procedure Laterality Date  . Wrist surgery     History reviewed. No pertinent family history. History  Substance Use Topics  . Smoking status: Never Smoker   . Smokeless tobacco: Not on file  . Alcohol Use: Yes     Comment: occ    Review of Systems  Constitutional: Positive for fever and chills.  HENT: Positive for sore throat. Negative for congestion.   Respiratory: Negative for cough.   Gastrointestinal: Negative for nausea, vomiting, abdominal pain and change in bowel habit.  Musculoskeletal: Negative for neck pain.  All other systems reviewed and are negative.     Allergies  Review of patient's allergies indicates no known allergies.  Home Medications   Prior to Admission medications   Medication Sig Start Date End Date Taking? Authorizing Provider  Alum & Mag Hydroxide-Simeth (MAGIC MOUTHWASH) SOLN Take 5 mLs by mouth 3 (three) times daily as needed for mouth pain. 08/19/14    Bobette Leyh, PA-C  ibuprofen (ADVIL,MOTRIN) 600 MG tablet Take 1 tablet (600 mg total) by mouth every 6 (six) hours as needed. 08/19/14   Alzena Gerber, PA-C  ibuprofen (ADVIL,MOTRIN) 800 MG tablet Take 1 tablet (800 mg total) by mouth 3 (three) times daily. 05/14/14   Fayrene HelperBowie Tran, PA-C  methocarbamol (ROBAXIN) 500 MG tablet Take 1 tablet (500 mg total) by mouth 2 (two) times daily. 05/14/14   Fayrene HelperBowie Tran, PA-C   BP 114/68 mmHg  Pulse 80  Temp(Src) 98.2 F (36.8 C) (Oral)  Resp 16  SpO2 97% Physical Exam  Constitutional: He is oriented to person, place, and time. He appears well-developed and well-nourished. No distress.  HENT:  Head: Normocephalic and atraumatic.  Right Ear: Hearing, tympanic membrane, external ear and ear canal normal.  Left Ear: Hearing, tympanic membrane, external ear and ear canal normal.  Nose: Nose normal.  Mouth/Throat: Uvula is midline and mucous membranes are normal. Posterior oropharyngeal erythema present. No oropharyngeal exudate, posterior oropharyngeal edema or tonsillar abscesses.  Eyes: Conjunctivae are normal.  Neck: Normal range of motion. Neck supple.  No nuchal rigidity.   Cardiovascular: Normal rate, regular rhythm and normal heart sounds.   Pulmonary/Chest: Effort normal and breath sounds normal.  Abdominal: Soft.  Musculoskeletal: Normal range of motion.  Lymphadenopathy:    He has cervical adenopathy.  Neurological: He is alert and oriented to person, place, and time.  Skin: Skin is warm and dry. He is not diaphoretic.  Psychiatric: He has a normal mood and affect.  Nursing note and vitals reviewed.  ED Course  Procedures (including critical care time) Medications  magic mouthwash (5 mLs Oral Given 08/20/14 0019)  ibuprofen (ADVIL,MOTRIN) tablet 800 mg (800 mg Oral Given 08/20/14 0006)    Labs Review Labs Reviewed  RAPID STREP SCREEN  CULTURE, GROUP A STREP    Imaging Review No results found.   EKG  Interpretation None      MDM   Final diagnoses:  Viral pharyngitis    Filed Vitals:   08/20/14 0024  BP: 114/68  Pulse: 80  Temp: 98.2 F (36.8 C)  Resp: 16   Afebrile, NAD, non-toxic appearing, AAOx4.  Pt afebrile without tonsillar exudate, negative strep. Presents with mild cervical lymphadenopathy, & dysphagia; diagnosis of viral pharyngitis. No abx indicated. DC w symptomatic tx for pain  Pt does not appear dehydrated, but did discuss importance of water rehydration. Presentation non concerning for PTA or infxn spread to soft tissue. No trismus or uvula deviation. Specific return precautions discussed. Pt able to drink water in ED without difficulty with intact air way. Recommended PCP follow up. Patient is stable at time of discharge      Francee PiccoloJennifer Florabelle Cardin, PA-C 08/20/14 0047  Dione Boozeavid Glick, MD 08/20/14 [phone]

## 2014-08-19 NOTE — Discharge Instructions (Signed)
You have a viral infection. Please follow up with your primary care physician in 1-2 days. If you do not have one please call the [facility] number listed above.Please alternate between Motrin and Tylenol every three hours for fevers and pain. Please read all discharge instructions and return precautions.   Pharyngitis Pharyngitis is redness, pain, and swelling (inflammation) of your pharynx.  CAUSES  Pharyngitis is usually caused by infection. Most of the time, these infections are from viruses (viral) and are part of a cold. However, sometimes pharyngitis is caused by bacteria (bacterial). Pharyngitis can also be caused by allergies. Viral pharyngitis may be spread from person to person by coughing, sneezing, and personal items or utensils (cups, forks, spoons, toothbrushes). Bacterial pharyngitis may be spread from person to person by more intimate contact, such as kissing.  SIGNS AND SYMPTOMS  Symptoms of pharyngitis include:   Sore throat.   Tiredness (fatigue).   Low-grade fever.   Headache.  Joint pain and muscle aches.  Skin rashes.  Swollen lymph nodes.  Plaque-like film on throat or tonsils (often seen with bacterial pharyngitis). DIAGNOSIS  Your health care provider will ask you questions about your illness and your symptoms. Your medical history, along with a physical exam, is often all that is needed to diagnose pharyngitis. Sometimes, a rapid strep test is done. Other lab tests may also be done, depending on the suspected cause.  TREATMENT  Viral pharyngitis will usually get better in 3-4 days without the use of medicine. Bacterial pharyngitis is treated with medicines that kill germs (antibiotics).  HOME CARE INSTRUCTIONS   Drink enough water and fluids to keep your urine clear or pale yellow.   Only take over-the-counter or prescription medicines as directed by your health care provider:   If you are prescribed antibiotics, make sure you  finish them even if you start to feel better.   Do not take aspirin.   Get lots of rest.   Gargle with 8 oz of salt water ( tsp of salt per 1 qt of water) as often as every 1-2 hours to soothe your throat.   Throat lozenges (if you are not at risk for choking) or sprays may be used to soothe your throat. SEEK MEDICAL CARE IF:   You have large, tender lumps in your neck.  You have a rash.  You cough up green, yellow-brown, or bloody spit. SEEK IMMEDIATE MEDICAL CARE IF:   Your neck becomes stiff.  You drool or are unable to swallow liquids.  You vomit or are unable to keep medicines or liquids down.  You have severe pain that does not go away with the use of recommended medicines.  You have trouble breathing (not caused by a stuffy nose). MAKE SURE YOU:   Understand these instructions.  Will watch your condition.  Will get help right away if you are not doing well or get worse. Document Released: 03/27/2005 Document Revised: 01/15/2013 Document Reviewed: 12/02/2012 [facility] Patient Information 2015 [place]. This information is not intended to replace advice given to you by your health care provider. Make sure you discuss any questions you have with your health care provider.

## 2014-08-19 NOTE — ED Notes (Signed)
The patient said he has had a sore throat for two to three days.  He denies any fever, cough or any other symptoms.

## 2014-08-20 NOTE — ED Notes (Signed)
Magic mouthwash requested from pharmacy

## 2014-08-20 NOTE — ED Notes (Signed)
Pt and significant other requesting antibiotic prescription for pt.  This RN explained rationale behind not prescribing.  This RN spoke to PA and PA sts she spoke to pt as well.  Pt seemed irritated with prescriptions and sts "I'll just get some antibiotics from my cousin".

## 2014-08-22 LAB — CULTURE, GROUP A STREP

## 2015-07-01 ENCOUNTER — Encounter (HOSPITAL_COMMUNITY): Payer: Self-pay | Admitting: Emergency Medicine

## 2015-07-01 ENCOUNTER — Emergency Department (HOSPITAL_COMMUNITY)
Admission: EM | Admit: 2015-07-01 | Discharge: 2015-07-01 | Disposition: A | Discharge status: Home / Self Care | Payer: BLUE CROSS/BLUE SHIELD | Attending: Emergency Medicine | Admitting: Emergency Medicine

## 2015-07-01 DIAGNOSIS — R112 Nausea with vomiting, unspecified: Secondary | ICD-10-CM | POA: Diagnosis not present

## 2015-07-01 DIAGNOSIS — R197 Diarrhea, unspecified: Secondary | ICD-10-CM | POA: Diagnosis not present

## 2015-07-01 LAB — CBC WITH DIFFERENTIAL/PLATELET
BASOS PCT: 0 %
Basophils Absolute: 0 10*3/uL (ref 0.0–0.1)
EOS ABS: 0.1 10*3/uL (ref 0.0–0.7)
Eosinophils Relative: 0 %
HCT: 50 % (ref 39.0–52.0)
HEMOGLOBIN: 16.7 g/dL (ref 13.0–17.0)
LYMPHS ABS: 0.5 10*3/uL — AB (ref 0.7–4.0)
Lymphocytes Relative: 4 %
MCH: 31.3 pg (ref 26.0–34.0)
MCHC: 33.4 g/dL (ref 30.0–36.0)
MCV: 93.8 fL (ref 78.0–100.0)
Monocytes Absolute: 1.2 10*3/uL — ABNORMAL HIGH (ref 0.1–1.0)
Monocytes Relative: 9 %
NEUTROS ABS: 10.8 10*3/uL — AB (ref 1.7–7.7)
NEUTROS PCT: 87 %
Platelets: 191 10*3/uL (ref 150–400)
RBC: 5.33 MIL/uL (ref 4.22–5.81)
RDW: 12.3 % (ref 11.5–15.5)
WBC: 12.5 10*3/uL — AB (ref 4.0–10.5)

## 2015-07-01 LAB — I-STAT CHEM 8, ED
BUN: 16 mg/dL (ref 6–20)
CHLORIDE: 98 mmol/L — AB (ref 101–111)
Calcium, Ion: 1.15 mmol/L (ref 1.12–1.23)
Creatinine, Ser: 1.1 mg/dL (ref 0.61–1.24)
Glucose, Bld: 94 mg/dL (ref 65–99)
HEMATOCRIT: 53 % — AB (ref 39.0–52.0)
Hemoglobin: 18 g/dL — ABNORMAL HIGH (ref 13.0–17.0)
Potassium: 3.9 mmol/L (ref 3.5–5.1)
SODIUM: 142 mmol/L (ref 135–145)
TCO2: 30 mmol/L (ref 0–100)

## 2015-07-01 MED ORDER — ONDANSETRON HCL 4 MG/2ML IJ SOLN
4.0000 mg | Freq: Once | INTRAMUSCULAR | Status: AC
  Administered 2015-07-01: 4 mg via INTRAVENOUS
  Filled 2015-07-01: qty 2

## 2015-07-01 MED ORDER — SODIUM CHLORIDE 0.9 % IV BOLUS (SEPSIS)
1000.0000 mL | Freq: Once | INTRAVENOUS | Status: AC
  Administered 2015-07-01: 1000 mL via INTRAVENOUS

## 2015-07-01 MED ORDER — ONDANSETRON HCL 4 MG PO TABS: 4.0000 mg | ORAL_TABLET | Freq: Four times a day (QID) | ORAL | Status: DC

## 2015-07-01 NOTE — ED Notes (Signed)
N/v/d and fever since last night

## 2015-07-01 NOTE — Discharge Instructions (Signed)

## 2015-07-01 NOTE — ED Notes (Signed)
Pt given ginger ale to drink for fluid challenge. 

## 2015-07-01 NOTE — ED Provider Notes (Signed)
CSN: [id number]     Arrival date & time 07/01/15  1025 History   First MD Initiated Contact with Patient 07/01/15 1030     Chief Complaint  Patient presents with  . Emesis  . Diarrhea     (Consider location/radiation/quality/duration/timing/severity/associated sxs/prior Treatment) HPI Comments: Patient presents to the emergency department with chief complaint of nausea, vomiting, diarrhea. He states that his symptoms started last night. He reports subjective fevers, but did not measure it himself. Denies any abdominal pain. He reports being around sick contacts with similar symptoms. There are no modifying factors. He denies any other medical problems. He has not taken anything for his symptoms.  The history is provided by the patient. No language interpreter was used.    History reviewed. No pertinent past medical history. Past Surgical History  Procedure Laterality Date  . Wrist surgery     No family history on file. Social History  Substance Use Topics  . Smoking status: Never Smoker   . Smokeless tobacco: None  . Alcohol Use: Yes     Comment: occ    Review of Systems  Constitutional: Negative for fever and chills.  Respiratory: Negative for shortness of breath.   Cardiovascular: Negative for chest pain.  Gastrointestinal: Positive for nausea, vomiting and diarrhea. Negative for constipation.  Genitourinary: Negative for dysuria.  All other systems reviewed and are negative.     Allergies  Review of patient's allergies indicates no known allergies.  Home Medications   Prior to Admission medications   Medication Sig Start Date End Date Taking? Authorizing Provider  Alum & Mag Hydroxide-Simeth (MAGIC MOUTHWASH) SOLN Take 5 mLs by mouth 3 (three) times daily as needed for mouth pain. Patient not taking: Reported on 07/01/2015 08/19/14   Francee Piccolo, PA-C  ibuprofen (ADVIL,MOTRIN) 600 MG tablet Take 1 tablet (600 mg total) by mouth every 6 (six) hours as  needed. Patient not taking: Reported on 07/01/2015 08/19/14   Francee Piccolo, PA-C  ibuprofen (ADVIL,MOTRIN) 800 MG tablet Take 1 tablet (800 mg total) by mouth 3 (three) times daily. Patient not taking: Reported on 07/01/2015 05/14/14   Fayrene Helper, PA-C  methocarbamol (ROBAXIN) 500 MG tablet Take 1 tablet (500 mg total) by mouth 2 (two) times daily. Patient not taking: Reported on 07/01/2015 05/14/14   Fayrene Helper, PA-C   BP 110/73 mmHg  Pulse 82  Temp(Src) 98.8 F (37.1 C) (Oral)  Resp 16  Ht  (1.676 m)  Wt 63.504 kg  BMI 22.61 kg/m2 Physical Exam  Constitutional: He is oriented to person, place, and time. He appears well-developed and well-nourished.  HENT:  Head: Normocephalic and atraumatic.  Eyes: Conjunctivae and EOM are normal. Pupils are equal, round, and reactive to light. Right eye exhibits no discharge. Left eye exhibits no discharge. No scleral icterus.  Neck: Normal range of motion. Neck supple. No JVD present.  Cardiovascular: Normal rate, regular rhythm and normal heart sounds.  Exam reveals no gallop and no friction rub.   No murmur heard. Pulmonary/Chest: Effort normal and breath sounds normal. No respiratory distress. He has no wheezes. He has no rales. He exhibits no tenderness.  Abdominal: Soft. He exhibits no distension and no mass. There is no tenderness. There is no rebound and no guarding.  No focal abdominal tenderness, no RLQ tenderness or pain at McBurney's point, no RUQ tenderness or Murphy's sign, no left-sided abdominal tenderness, no fluid wave, or signs of peritonitis   Musculoskeletal: Normal range of motion. He exhibits no edema  or tenderness.  Neurological: He is alert and oriented to person, place, and time.  Skin: Skin is warm and dry.  Psychiatric: He has a normal mood and affect. His behavior is normal. Judgment and thought content normal.  Nursing note and vitals reviewed.   ED Course  Procedures (including critical care time) Results for  orders placed or performed during the hospital encounter of 07/01/15  CBC with Differential/Platelet  Result Value Ref Range   WBC 12.5 (H) 4.0 - 10.5 K/uL   RBC 5.33 4.22 - 5.81 MIL/uL   Hemoglobin 16.7 13.0 - 17.0 g/dL   HCT 45.450.0 09.839.0 - 11.952.0 %   MCV 93.8 78.0 - 100.0 fL   MCH 31.3 26.0 - 34.0 pg   MCHC 33.4 30.0 - 36.0 g/dL   RDW 14.712.3 82.911.5 - 56.215.5 %   Platelets 191 150 - 400 K/uL   Neutrophils Relative % 87 %   Neutro Abs 10.8 (H) 1.7 - 7.7 K/uL   Lymphocytes Relative 4 %   Lymphs Abs 0.5 (L) 0.7 - 4.0 K/uL   Monocytes Relative 9 %   Monocytes Absolute 1.2 (H) 0.1 - 1.0 K/uL   Eosinophils Relative 0 %   Eosinophils Absolute 0.1 0.0 - 0.7 K/uL   Basophils Relative 0 %   Basophils Absolute 0.0 0.0 - 0.1 K/uL  I-stat chem 8, ed  Result Value Ref Range   Sodium 142 135 - 145 mmol/L   Potassium 3.9 3.5 - 5.1 mmol/L   Chloride 98 (L) 101 - 111 mmol/L   BUN 16 6 - 20 mg/dL   Creatinine, Ser 1.301.10 0.61 - 1.24 mg/dL   Glucose, Bld 94 65 - 99 mg/dL   Calcium, Ion 8.651.15 7.841.12 - 1.23 mmol/L   TCO2 30 0 - 100 mmol/L   Hemoglobin 18.0 (H) 13.0 - 17.0 g/dL   HCT 69.653.0 (H) 29.539.0 - 28.452.0 %   No results found.  I have personally reviewed and evaluated these images and lab results as part of my medical decision-making.    MDM   Final diagnoses:  Nausea vomiting and diarrhea   Patient with nausea, vomiting, diarrhea since last night. Vital signs are stable. No abdominal tenderness on exam. Patient feels improved after fluids and Zofran. Tolerating oral intake.  Mild leukocytosis to 12.5. Vital signs are stable. Discharge to home.    Roxy Horsemanobert Neftali Thurow, PA-C 07/01/15 1315  Pricilla LovelessScott Goldston, MD 07/04/15 [phone]

## 2015-07-01 NOTE — ED Notes (Signed)
NAD at this time. Pt is stable and going home.  

## 2017-07-04 ENCOUNTER — Encounter (HOSPITAL_COMMUNITY): Payer: Self-pay | Admitting: Emergency Medicine

## 2017-07-04 ENCOUNTER — Ambulatory Visit (HOSPITAL_COMMUNITY)
Admission: EM | Admit: 2017-07-04 | Discharge: 2017-07-04 | Disposition: A | Discharge status: Home / Self Care | Payer: Self-pay | Attending: Family Medicine | Admitting: Family Medicine

## 2017-07-04 DIAGNOSIS — K529 Noninfective gastroenteritis and colitis, unspecified: Secondary | ICD-10-CM

## 2017-07-04 MED ORDER — ONDANSETRON 4 MG PO TBDP
4.0000 mg | ORAL_TABLET | Freq: Three times a day (TID) | ORAL | 0 refills | Status: AC | PRN
Start: 2017-07-04 — End: ?

## 2017-07-04 NOTE — ED Provider Notes (Signed)
[facility]   [id number] 07/04/17 Arrival Time: 1516  ASSESSMENT & PLAN:  1. Gastroenteritis     Meds ordered this encounter  Medications  . ondansetron (ZOFRAN-ODT) 4 MG disintegrating tablet    Sig: Take 1 tablet (4 mg total) by mouth every 8 (eight) hours as needed for nausea or vomiting.    Dispense:  15 tablet    Refill:  0   Discussed typical duration of symptoms for suspected viral GI illness. Will do his best to ensure adequate fluid intake in order to avoid dehydration. Will proceed to the Emergency Department for evaluation if unable to tolerate PO fluids regularly.  Otherwise he will f/u with his PCP or here if not showing improvement over the next 48-72 hours.  Reviewed expectations re: course of current medical issues. Questions answered. Outlined signs and symptoms indicating need for more acute intervention. Patient verbalized understanding. After Visit Summary given.   SUBJECTIVE: History from: patient.  Riley Ramos is a 27 y.o. male who presents with complaint of non-bloody intermittent nausea and vomiting of brown material with diarrhea. Onset abrupt, yesterday. Abdominal discomfort: mild and cramping. Symptoms are stable since beginning. Aggravating factors: eating. Alleviating factors: none. Associated symptoms: fatigue. He denies fever. Appetite: decreased. PO intake: decreased. Ambulatory without assistance. Urinary symptoms: none. Last bowel movement today without blood. OTC treatment: none.  Past Surgical History:  Procedure Laterality Date  . WRIST SURGERY      ROS: As per HPI.  OBJECTIVE:  Vitals:   07/04/17 1538  BP: 129/82  Pulse: 84  Resp: 18  Temp: 98.5 F (36.9 C)  TempSrc: Oral  SpO2: 100%    General appearance: alert; no distress Oropharynx: moist Lungs: clear to auscultation bilaterally Heart: regular rate and rhythm Abdomen: soft; non-distended; no significant abdominal tenderness, "just a cramping feeling"; bowel  sounds present; no masses or organomegaly; no guarding or rebound tenderness Back: no CVA tenderness Extremities: no edema; symmetrical with no gross deformities Skin: warm and dry Neurologic: normal gait Psychological: alert and cooperative; normal mood and affect  No Known Allergies                                              Social History   Socioeconomic History  . Marital status: Single    Spouse name: Not on file  . Number of children: Not on file  . Years of education: Not on file  . Highest education level: Not on file  Occupational History  . Not on file  Social Needs  . Financial resource strain: Not on file  . Food insecurity:    Worry: Not on file    Inability: Not on file  . Transportation needs:    Medical: Not on file    Non-medical: Not on file  Tobacco Use  . Smoking status: Never Smoker  Substance and Sexual Activity  . Alcohol use: Yes    Comment: occ  . Drug use: No  . Sexual activity: Not on file  Lifestyle  . Physical activity:    Days per week: Not on file    Minutes per session: Not on file  . Stress: Not on file  Relationships  . Social connections:    Talks on phone: Not on file    Gets together: Not on file    Attends religious service: Not on file  Active member of club or organization: Not on file    Attends meetings of clubs or organizations: Not on file    Relationship status: Not on file  . Intimate partner violence:    Fear of current or ex partner: Not on file    Emotionally abused: Not on file    Physically abused: Not on file    Forced sexual activity: Not on file  Other Topics Concern  . Not on file  Social History Narrative  . Not on file      Mardella Layman, MD 07/07/17 1146

## 2017-07-04 NOTE — Discharge Instructions (Signed)

## 2017-07-04 NOTE — ED Triage Notes (Signed)
Pt sts N/V/D starting last night  

## 2018-12-21 ENCOUNTER — Other Ambulatory Visit: Payer: Self-pay

## 2018-12-21 ENCOUNTER — Observation Stay (HOSPITAL_COMMUNITY)
Admission: EM | Admit: 2018-12-21 | Discharge: 2018-12-23 | Payer: Self-pay | Attending: Emergency Medicine | Admitting: Emergency Medicine

## 2018-12-21 ENCOUNTER — Emergency Department (HOSPITAL_COMMUNITY): Payer: Self-pay

## 2018-12-21 ENCOUNTER — Encounter (HOSPITAL_COMMUNITY): Payer: Self-pay | Admitting: *Deleted

## 2018-12-21 DIAGNOSIS — T1490XA Injury, unspecified, initial encounter: Secondary | ICD-10-CM

## 2018-12-21 DIAGNOSIS — S21141A Puncture wound with foreign body of right front wall of thorax without penetration into thoracic cavity, initial encounter: Secondary | ICD-10-CM | POA: Insufficient documentation

## 2018-12-21 DIAGNOSIS — W3400XA Accidental discharge from unspecified firearms or gun, initial encounter: Secondary | ICD-10-CM

## 2018-12-21 DIAGNOSIS — Z20828 Contact with and (suspected) exposure to other viral communicable diseases: Secondary | ICD-10-CM | POA: Insufficient documentation

## 2018-12-21 DIAGNOSIS — S51832A Puncture wound without foreign body of left forearm, initial encounter: Principal | ICD-10-CM | POA: Insufficient documentation

## 2018-12-21 DIAGNOSIS — S41141A Puncture wound with foreign body of right upper arm, initial encounter: Secondary | ICD-10-CM | POA: Insufficient documentation

## 2018-12-21 DIAGNOSIS — S27329A Contusion of lung, unspecified, initial encounter: Secondary | ICD-10-CM | POA: Insufficient documentation

## 2018-12-21 LAB — CBC
HCT: 42.8 % (ref 39.0–52.0)
Hemoglobin: 14.9 g/dL (ref 13.0–17.0)
MCH: 32.8 pg (ref 26.0–34.0)
MCHC: 34.8 g/dL (ref 30.0–36.0)
MCV: 94.3 fL (ref 80.0–100.0)
Platelets: 193 10*3/uL (ref 150–400)
RBC: 4.54 MIL/uL (ref 4.22–5.81)
RDW: 11.7 % (ref 11.5–15.5)
WBC: 9.4 10*3/uL (ref 4.0–10.5)
nRBC: 0 % (ref 0.0–0.2)

## 2018-12-21 LAB — PROTIME-INR
INR: 1 (ref 0.8–1.2)
Prothrombin Time: 13.4 seconds (ref 11.4–15.2)

## 2018-12-21 MED ORDER — IOHEXOL 300 MG/ML  SOLN
100.0000 mL | Freq: Once | INTRAMUSCULAR | Status: AC | PRN
  Administered 2018-12-21: 100 mL via INTRAVENOUS

## 2018-12-21 NOTE — ED Notes (Signed)
To ct

## 2018-12-21 NOTE — ED Notes (Signed)
pts mother has been contacted  Pt has been swabbed waiting for order

## 2018-12-21 NOTE — ED Notes (Signed)
bp 138/80
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
## 2018-12-22 ENCOUNTER — Observation Stay (HOSPITAL_COMMUNITY): Payer: Self-pay

## 2018-12-22 ENCOUNTER — Emergency Department (HOSPITAL_COMMUNITY): Payer: Self-pay

## 2018-12-22 ENCOUNTER — Other Ambulatory Visit: Payer: Self-pay

## 2018-12-22 ENCOUNTER — Encounter (HOSPITAL_COMMUNITY): Payer: Self-pay

## 2018-12-22 DIAGNOSIS — W3400XA Accidental discharge from unspecified firearms or gun, initial encounter: Secondary | ICD-10-CM

## 2018-12-22 LAB — LACTIC ACID, PLASMA: Lactic Acid, Venous: 2.2 mmol/L (ref 0.5–1.9)

## 2018-12-22 LAB — COMPREHENSIVE METABOLIC PANEL
ALT: 14 U/L (ref 0–44)
AST: 24 U/L (ref 15–41)
Albumin: 4 g/dL (ref 3.5–5.0)
Alkaline Phosphatase: 64 U/L (ref 38–126)
Anion gap: 13 (ref 5–15)
BUN: 6 mg/dL (ref 6–20)
CO2: 21 mmol/L — ABNORMAL LOW (ref 22–32)
Calcium: 9.1 mg/dL (ref 8.9–10.3)
Chloride: 103 mmol/L (ref 98–111)
Creatinine, Ser: 1.15 mg/dL (ref 0.61–1.24)
GFR calc Af Amer: 44 mL/min — ABNORMAL LOW (ref >60)
GFR calc non Af Amer: 38 mL/min — ABNORMAL LOW (ref >60)
Glucose, Bld: 158 mg/dL — ABNORMAL HIGH (ref 70–99)
Potassium: 3.9 mmol/L (ref 3.5–5.1)
Sodium: 137 mmol/L (ref 135–145)
Total Bilirubin: 0.8 mg/dL (ref 0.3–1.2)
Total Protein: 6.6 g/dL (ref 6.5–8.1)

## 2018-12-22 LAB — I-STAT CHEM 8, ED
BUN: 7 mg/dL (ref 6–20)
Calcium, Ion: 1.01 mmol/L — ABNORMAL LOW (ref 1.15–1.40)
Chloride: 103 mmol/L (ref 98–111)
Creatinine, Ser: 1.1 mg/dL (ref 0.61–1.24)
Glucose, Bld: 158 mg/dL — ABNORMAL HIGH (ref 70–99)
HCT: 44 % (ref 39.0–52.0)
Hemoglobin: 15 g/dL (ref 13.0–17.0)
Potassium: 3.8 mmol/L (ref 3.5–5.1)
Sodium: 137 mmol/L (ref 135–145)
TCO2: 23 mmol/L (ref 22–32)

## 2018-12-22 LAB — URINALYSIS, ROUTINE W REFLEX MICROSCOPIC
Bacteria, UA: NONE SEEN
Bilirubin Urine: NEGATIVE
Glucose, UA: NEGATIVE mg/dL
Hgb urine dipstick: NEGATIVE
Ketones, ur: 5 mg/dL — AB
Leukocytes,Ua: NEGATIVE
Nitrite: NEGATIVE
Protein, ur: NEGATIVE mg/dL
Specific Gravity, Urine: >1.046 — ABNORMAL HIGH (ref 1.005–1.030)
pH: 6 (ref 5.0–8.0)

## 2018-12-22 LAB — CDS SEROLOGY

## 2018-12-22 LAB — SAMPLE TO BLOOD BANK

## 2018-12-22 LAB — HIV ANTIBODY (ROUTINE TESTING W REFLEX): HIV Screen 4th Generation wRfx: NONREACTIVE

## 2018-12-22 LAB — SARS CORONAVIRUS 2 BY RT PCR (HOSPITAL ORDER, PERFORMED IN ~~LOC~~ HOSPITAL LAB): SARS Coronavirus 2: NEGATIVE

## 2018-12-22 MED ORDER — OXYCODONE HCL 5 MG PO TABS
5.0000 mg | ORAL_TABLET | ORAL | Status: DC | PRN
  Administered 2018-12-22: 5 mg via ORAL
  Filled 2018-12-22 (×2): qty 1

## 2018-12-22 MED ORDER — ONDANSETRON 4 MG PO TBDP: 4.0000 mg | ORAL_TABLET | Freq: Four times a day (QID) | ORAL | Status: DC | PRN

## 2018-12-22 MED ORDER — INFLUENZA VAC SPLIT QUAD 0.5 ML IM SUSY
0.5000 mL | PREFILLED_SYRINGE | INTRAMUSCULAR | Status: DC
  Filled 2018-12-22: qty 0.5

## 2018-12-22 MED ORDER — MORPHINE SULFATE (PF) 2 MG/ML IV SOLN: 2.0000 mg | INTRAVENOUS | Status: DC | PRN

## 2018-12-22 MED ORDER — ONDANSETRON HCL 4 MG/2ML IJ SOLN: 4.0000 mg | Freq: Four times a day (QID) | INTRAMUSCULAR | Status: DC | PRN

## 2018-12-22 MED ORDER — ACETAMINOPHEN 325 MG PO TABS
650.0000 mg | ORAL_TABLET | ORAL | Status: DC | PRN
  Administered 2018-12-22: 650 mg via ORAL
  Filled 2018-12-22: qty 2
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
## 2018-12-22 NOTE — ED Notes (Signed)
Wound cleaned and bandaged    Wounds oozing blood
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
## 2018-12-22 NOTE — ED Notes (Signed)
Report called to rn on 6 n 
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
## 2018-12-22 NOTE — ED Notes (Signed)
Just some numbness and tingling in hi rt ring and little fingers  Good radial pulse on the right

## 2018-12-23 ENCOUNTER — Observation Stay (HOSPITAL_COMMUNITY): Payer: Self-pay

## 2018-12-23 ENCOUNTER — Encounter (HOSPITAL_COMMUNITY): Payer: Self-pay | Admitting: Emergency Medicine

## 2018-12-23 LAB — CBC
HCT: 41.1 % (ref 39.0–52.0)
Hemoglobin: 14 g/dL (ref 13.0–17.0)
MCH: 32.3 pg (ref 26.0–34.0)
MCHC: 34.1 g/dL (ref 30.0–36.0)
MCV: 94.7 fL (ref 80.0–100.0)
Platelets: 175 10*3/uL (ref 150–400)
RBC: 4.34 MIL/uL (ref 4.22–5.81)
RDW: 11.8 % (ref 11.5–15.5)
WBC: 8.5 10*3/uL (ref 4.0–10.5)
nRBC: 0 % (ref 0.0–0.2)

## 2018-12-23 MED ORDER — OXYCODONE HCL 5 MG PO TABS: 5.0000 mg | ORAL_TABLET | ORAL | 0 refills | Status: AC | PRN

## 2018-12-23 MED ORDER — ACETAMINOPHEN 325 MG PO TABS: 650.0000 mg | ORAL_TABLET | ORAL | Status: AC | PRN

## 2018-12-23 MED ORDER — GABAPENTIN 100 MG PO CAPS: 200.0000 mg | ORAL_CAPSULE | Freq: Two times a day (BID) | ORAL | 0 refills | Status: AC | PRN

## 2018-12-23 NOTE — Discharge Instructions (Signed)
Neosporin to all wounds and cover with dry bandage until healed.  May shower  Wound Care, Adult Taking care of your wound properly can help to prevent pain, infection, and scarring. It can also help your wound to heal more quickly. How to care for your wound Wound care      Follow instructions from your health care provider about how to take care of your wound. Make sure you: ? Wash your hands with soap and water before you change the bandage (dressing). If soap and water are not available, use hand sanitizer. ? Change your dressing as told by your health care provider. ? Leave stitches (sutures), skin glue, or adhesive strips in place. These skin closures may need to stay in place for 2 weeks or longer. If adhesive strip edges start to loosen and curl up, you may trim the loose edges. Do not remove adhesive strips completely unless your health care provider tells you to do that.  Check your wound area every day for signs of infection. Check for: ? Redness, swelling, or pain. ? Fluid or blood. ? Warmth. ? Pus or a bad smell.  Ask your health care provider if you should clean the wound with mild soap and water. Doing this may include: ? Using a clean towel to pat the wound dry after cleaning it. Do not rub or scrub the wound. ? Applying a cream or ointment. Do this only as told by your health care provider. ? Covering the incision with a clean dressing.  Ask your health care provider when you can leave the wound uncovered.  Keep the dressing dry until your health care provider says it can be removed. Do not take baths, swim, use a hot tub, or do anything that would put the wound underwater until your health care provider approves. Ask your health care provider if you can take showers. You may only be allowed to take sponge baths. Medicines   If you were prescribed an antibiotic medicine, cream, or ointment, take or use the antibiotic as told by your health care provider. Do not stop  taking or using the antibiotic even if your condition improves.  Take over-the-counter and prescription medicines only as told by your health care provider. If you were prescribed pain medicine, take it 30 or more minutes before you do any wound care or as told by your health care provider. General instructions  Return to your normal activities as told by your health care provider. Ask your health care provider what activities are safe.  Do not scratch or pick at the wound.  Do not use any products that contain nicotine or tobacco, such as cigarettes and e-cigarettes. These may delay wound healing. If you need help quitting, ask your health care provider.  Keep all follow-up visits as told by your health care provider. This is important.  Eat a diet that includes protein, vitamin A, vitamin C, and other nutrient-rich foods to help the wound heal. ? Foods rich in protein include meat, dairy, beans, nuts, and other sources. ? Foods rich in vitamin A include carrots and dark green, leafy vegetables. ? Foods rich in vitamin C include citrus, tomatoes, and other fruits and vegetables. ? Nutrient-rich foods have protein, carbohydrates, fat, vitamins, or minerals. Eat a variety of healthy foods including vegetables, fruits, and whole grains. Contact a health care provider if:  You received a tetanus shot and you have swelling, severe pain, redness, or bleeding at the injection site.  Your pain is  not controlled with medicine.  You have redness, swelling, or pain around the wound.  You have fluid or blood coming from the wound.  Your wound feels warm to the touch.  You have pus or a bad smell coming from the wound.  You have a fever or chills.  You are nauseous or you vomit.  You are dizzy. Get help right away if:  You have a red streak going away from your wound.  The edges of the wound open up and separate.  Your wound is bleeding, and the bleeding does not stop with gentle  pressure.  You have a rash.  You faint.  You have trouble breathing. Summary  Always wash your hands with soap and water before changing your bandage (dressing).  To help with healing, eat foods that are rich in protein, vitamin A, vitamin C, and other nutrients.  Check your wound every day for signs of infection. Contact your health care provider if you suspect that your wound is infected. This information is not intended to replace advice given to you by your health care provider. Make sure you discuss any questions you have with your health care provider. Document Released: 01/04/2008 Document Revised: 07/15/2018 Document Reviewed: 10/12/2015 Elsevier Patient Education  2020 [employer].
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
## 2019-03-27 ENCOUNTER — Other Ambulatory Visit: Payer: Self-pay

## 2019-03-27 ENCOUNTER — Emergency Department (HOSPITAL_COMMUNITY): Payer: No Typology Code available for payment source

## 2019-03-27 ENCOUNTER — Emergency Department (HOSPITAL_COMMUNITY)
Admission: EM | Admit: 2019-03-27 | Discharge: 2019-03-27 | Disposition: A | Discharge status: Home / Self Care | Payer: No Typology Code available for payment source | Attending: Emergency Medicine | Admitting: Emergency Medicine

## 2019-03-27 ENCOUNTER — Encounter (HOSPITAL_COMMUNITY): Payer: Self-pay | Admitting: Emergency Medicine

## 2019-03-27 ENCOUNTER — Ambulatory Visit (HOSPITAL_COMMUNITY)
Admission: EM | Admit: 2019-03-27 | Discharge: 2019-03-27 | Disposition: A | Discharge status: Home / Self Care | Payer: Self-pay | Attending: Family Medicine | Admitting: Family Medicine

## 2019-03-27 DIAGNOSIS — Y929 Unspecified place or not applicable: Secondary | ICD-10-CM | POA: Insufficient documentation

## 2019-03-27 DIAGNOSIS — S161XXA Strain of muscle, fascia and tendon at neck level, initial encounter: Secondary | ICD-10-CM | POA: Diagnosis not present

## 2019-03-27 DIAGNOSIS — S0990XA Unspecified injury of head, initial encounter: Secondary | ICD-10-CM | POA: Diagnosis not present

## 2019-03-27 DIAGNOSIS — Z79899 Other long term (current) drug therapy: Secondary | ICD-10-CM | POA: Insufficient documentation

## 2019-03-27 DIAGNOSIS — Y999 Unspecified external cause status: Secondary | ICD-10-CM | POA: Diagnosis not present

## 2019-03-27 DIAGNOSIS — R519 Headache, unspecified: Secondary | ICD-10-CM | POA: Diagnosis not present

## 2019-03-27 DIAGNOSIS — Y939 Activity, unspecified: Secondary | ICD-10-CM | POA: Diagnosis not present

## 2019-03-27 DIAGNOSIS — M542 Cervicalgia: Secondary | ICD-10-CM | POA: Diagnosis present

## 2019-03-27 MED ORDER — METHOCARBAMOL 500 MG PO TABS: 500.0000 mg | ORAL_TABLET | Freq: Two times a day (BID) | ORAL | 0 refills | Status: AC

## 2019-03-27 NOTE — ED Notes (Addendum)
Complains of pain in his neck down radiating to his shoulders as well as pain in the left leg.
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
## 2023-12-05 ENCOUNTER — Ambulatory Visit (HOSPITAL_COMMUNITY): Payer: Self-pay
